# Patient Record
Sex: Female | Born: 1995 | Race: Black or African American | Hispanic: No | Marital: Single | Smoking: Never smoker
Health system: Southern US, Community
[De-identification: ages and names within clinical notes are randomized; demographics above are authoritative.]

---
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
## 2013-03-30 ENCOUNTER — Encounter (HOSPITAL_COMMUNITY): Payer: Self-pay | Admitting: Emergency Medicine

## 2013-03-30 ENCOUNTER — Other Ambulatory Visit (HOSPITAL_COMMUNITY)
Admission: RE | Admit: 2013-03-30 | Discharge: 2013-03-30 | Disposition: A | Discharge status: Home / Self Care | Payer: PRIVATE HEALTH INSURANCE | Source: Ambulatory Visit | Attending: Family Medicine | Admitting: Family Medicine

## 2013-03-30 ENCOUNTER — Emergency Department (INDEPENDENT_AMBULATORY_CARE_PROVIDER_SITE_OTHER)
Admission: EM | Admit: 2013-03-30 | Discharge: 2013-03-30 | Disposition: A | Discharge status: Home / Self Care | Payer: PRIVATE HEALTH INSURANCE | Source: Home / Self Care

## 2013-03-30 DIAGNOSIS — N760 Acute vaginitis: Secondary | ICD-10-CM | POA: Insufficient documentation

## 2013-03-30 DIAGNOSIS — Z113 Encounter for screening for infections with a predominantly sexual mode of transmission: Secondary | ICD-10-CM | POA: Insufficient documentation

## 2013-03-30 DIAGNOSIS — B373 Candidiasis of vulva and vagina: Secondary | ICD-10-CM

## 2013-03-30 LAB — POCT URINALYSIS DIP (DEVICE)
Bilirubin Urine: NEGATIVE
Glucose, UA: NEGATIVE mg/dL
Ketones, ur: NEGATIVE mg/dL
Nitrite: NEGATIVE
pH: 6 (ref 5.0–8.0)

## 2013-03-30 MED ORDER — FLUCONAZOLE 150 MG PO TABS: 150.0000 mg | ORAL_TABLET | Freq: Every day | ORAL | Status: DC

## 2013-03-30 NOTE — ED Notes (Signed)
C/o vaginal irritation since Friday.   No relief with otc yeast meds.  Denies pelvic/abdominal pain.

## 2013-03-30 NOTE — ED Provider Notes (Addendum)
Olivia Snow is a 17 y.o. female who presents to Urgent Care today for evaluation of vaginal itching.   Patient describes vaginal itching starting on Friday. Symptoms have been progressive. Associated with thick white vaginal discharge. No pelvic or abdominal pain. Just finished period recently LMP 03/24/13. Patient has tried over the counter yeast medications without relief. No other treatments. Feels similar to previous yeast infections.   ROS-Denies nausea/vomiting/fever/chills/fatigue/overall sick feelings. Denies abdominal pain, diarrhea. Denies dysuria or polyuria.   Past medical history-previously healthy with exception of 2 yeast infections in the past. Was treated for UTI in ED within last few months but no recent antibiotics (and urine culture was negative).  Social history-sexually active in last year with more than 1 partner. Last STD check 3-4 months ago.  Meds-no current meds Allergies-NKDA  History  Substance Use Topics  . Smoking status: Never Smoker   . Smokeless tobacco: Not on file  . Alcohol Use: No   ROS as above Medications reviewed. No current facility-administered medications for this encounter.   Current Outpatient Prescriptions  Medication Sig Dispense Refill  . norethindrone-ethinyl estradiol-iron (LOESTRIN FE 1.5/30) 1.5-30 MG-MCG tablet Take 1 tablet by mouth at bedtime.       Exam:  BP 117/60  Pulse 68  Temp(Src) 98.1 F (36.7 C) (Oral)  Resp 14  SpO2 100%  LMP 03/24/2013 Gen: Well NAD HEENT: Mucous membranes are moist. Lungs: Normal work of breathing. CTABL Heart: RRR no MRG Abd: NABS, Soft. NT, ND Pelvic: cervix normal in appearance, external genitalia normal, no adnexal masses or tenderness, no cervical motion tenderness and positive findings: vaginal discharge:  white, thick and curd-like Exts: no edema  Results for orders placed during the hospital encounter of 03/30/13 (from the past 24 hour(s))  POCT URINALYSIS DIP (DEVICE)      Status: Abnormal   Collection Time    03/30/13  7:35 PM      Result Value Range   Glucose, UA NEGATIVE  NEGATIVE mg/dL   Bilirubin Urine NEGATIVE  NEGATIVE   Ketones, ur NEGATIVE  NEGATIVE mg/dL   Specific Gravity, Urine 1.025  1.005 - 1.030   Hgb urine dipstick TRACE (*) NEGATIVE   pH 6.0  5.0 - 8.0   Protein, ur NEGATIVE  NEGATIVE mg/dL   Urobilinogen, UA 1.0  0.0 - 1.0 mg/dL   Nitrite NEGATIVE  NEGATIVE   Leukocytes, UA SMALL (*) NEGATIVE  POCT PREGNANCY, URINE     Status: None   Collection Time    03/30/13  7:38 PM      Result Value Range   Preg Test, Ur NEGATIVE  NEGATIVE   Assessment and Plan: 17 y.o. female with likely candidal yeast infection. Treated with diflucan 150mg  x1. Cervical sample sent for affirm testing (wet prep including trichomonas), gonorrhea, chlamydia. Will alert patient if other treatment required.   Trace blood noted on UA but period recently ended. Small leukocytes but patient asymptomatic. Will not treat for UTI.     Shelva Majestic, MD 03/30/13 2051   Addendum: wet prep positive for BV. Have sent in Rx and asked staff to communicate this with patient.   Shelva Majestic, MD 04/05/13 [phone]

## 2013-03-31 NOTE — ED Provider Notes (Signed)
Medical screening examination/treatment/procedure(s) were performed by resident physician or non-physician practitioner and as supervising physician I was immediately available for consultation/collaboration.   Zani Kyllonen DOUGLAS MD.   Avanthika Dehnert D Holley Kocurek, MD 03/31/13 1527 

## 2013-04-05 ENCOUNTER — Telehealth (HOSPITAL_COMMUNITY): Payer: Self-pay | Admitting: *Deleted

## 2013-04-05 MED ORDER — METRONIDAZOLE 500 MG PO TABS: 500.0000 mg | ORAL_TABLET | Freq: Two times a day (BID) | ORAL | Status: DC

## 2013-04-05 NOTE — ED Provider Notes (Signed)
Medical screening examination/treatment/procedure(s) were performed by resident physician or non-physician practitioner and as supervising physician I was immediately available for consultation/collaboration.   Lucresia Simic DOUGLAS MD.   Dub Maclellan D Tida Saner, MD 04/05/13 1826 

## 2013-04-05 NOTE — ED Notes (Addendum)
GC/Chlamydia neg., Affirm: Candida and Gardnerella pos. and Trich neg.  Pt. adequately treated with Diflucan for yeast. 12/18 Message sent to Dr. Artis Flock 12/19 Message sent to Dr. Durene Cal.  12/22 Dr. Durene Cal e-prescribed Flagyl to [facility] on [address]. I called pt. and left a message to call. Call 1. Olivia Snow 04/05/2013 Pt. called back.  Pt. verified x 2 and given results.  Pt. Told she was adequately treated with the Diflucan for yeast and needs Flagyl for bacterial vaginosis.  I explained what bacterial vaginosis was to her.   Pt. instructed to no alcohol while taking this medication. Pt. told where to pick up the Rx. Pt. voiced understanding.   04/05/2013

## 2013-05-29 ENCOUNTER — Other Ambulatory Visit (HOSPITAL_COMMUNITY)
Admission: RE | Admit: 2013-05-29 | Discharge: 2013-05-29 | Disposition: A | Discharge status: Home / Self Care | Payer: PRIVATE HEALTH INSURANCE | Source: Ambulatory Visit | Attending: Family Medicine | Admitting: Family Medicine

## 2013-05-29 ENCOUNTER — Emergency Department (INDEPENDENT_AMBULATORY_CARE_PROVIDER_SITE_OTHER)
Admission: EM | Admit: 2013-05-29 | Discharge: 2013-05-29 | Disposition: A | Discharge status: Home / Self Care | Payer: PRIVATE HEALTH INSURANCE | Source: Home / Self Care | Attending: Family Medicine | Admitting: Family Medicine

## 2013-05-29 ENCOUNTER — Encounter (HOSPITAL_COMMUNITY): Payer: Self-pay | Admitting: Emergency Medicine

## 2013-05-29 DIAGNOSIS — Z113 Encounter for screening for infections with a predominantly sexual mode of transmission: Secondary | ICD-10-CM | POA: Insufficient documentation

## 2013-05-29 DIAGNOSIS — N760 Acute vaginitis: Secondary | ICD-10-CM | POA: Insufficient documentation

## 2013-05-29 MED ORDER — FLUCONAZOLE 150 MG PO TABS: 150.0000 mg | ORAL_TABLET | Freq: Once | ORAL | Status: DC

## 2013-05-29 MED ORDER — METRONIDAZOLE 500 MG PO TABS: 500.0000 mg | ORAL_TABLET | Freq: Two times a day (BID) | ORAL | Status: DC

## 2013-05-29 MED ORDER — TERCONAZOLE 80 MG VA SUPP: 80.0000 mg | Freq: Every day | VAGINAL | Status: DC

## 2013-05-29 NOTE — ED Provider Notes (Signed)
CSN: [id number]     Arrival date & time 05/29/13  1853 History   First MD Initiated Contact with Patient 05/29/13 1858     Chief Complaint  Patient presents with  . Vaginal Itching     (Consider location/radiation/quality/duration/timing/severity/associated sxs/prior Treatment) Patient is a 18 y.o. female presenting with vaginal itching. The history is provided by the patient.  Vaginal Itching This is a new problem. The current episode started more than 2 days ago. The problem has been gradually worsening. Pertinent negatives include no abdominal pain.    History reviewed. No pertinent past medical history. History reviewed. No pertinent past surgical history. No family history on file. History  Substance Use Topics  . Smoking status: Never Smoker   . Smokeless tobacco: Not on file  . Alcohol Use: No   OB History   Grav Para Term Preterm Abortions TAB SAB Ect Mult Living                 Review of Systems  Constitutional: Negative.   Gastrointestinal: Negative for abdominal pain.  Genitourinary: Positive for vaginal discharge. Negative for vaginal bleeding, menstrual problem and pelvic pain.      Allergies  Review of patient's allergies indicates no known allergies.  Home Medications   Current Outpatient Rx  Name  Route  Sig  Dispense  Refill  . fluconazole (DIFLUCAN) 150 MG tablet   Oral   Take 1 tablet (150 mg total) by mouth daily.   1 tablet   0   . fluconazole (DIFLUCAN) 150 MG tablet   Oral   Take 1 tablet (150 mg total) by mouth once. Repeat in 1 week   1 tablet   1   . metroNIDAZOLE (FLAGYL) 500 MG tablet   Oral   Take 1 tablet (500 mg total) by mouth 2 (two) times daily. Do not use alcohol while taking this medication.   14 tablet   0   . metroNIDAZOLE (FLAGYL) 500 MG tablet   Oral   Take 1 tablet (500 mg total) by mouth 2 (two) times daily. Until finished   14 tablet   0   . nitrofurantoin, macrocrystal-monohydrate, (MACROBID) 100 MG  capsule   Oral   Take 1 capsule (100 mg total) by mouth 2 (two) times daily.   10 capsule   0   . norethindrone-ethinyl estradiol-iron (LOESTRIN FE 1.5/30) 1.5-30 MG-MCG tablet   Oral   Take 1 tablet by mouth at bedtime.         Marland Kitchen terconazole (TERAZOL 3) 80 MG vaginal suppository   Vaginal   Place 1 suppository (80 mg total) vaginally at bedtime.   3 suppository   0    BP 120/62  Pulse 74  Temp(Src) 99.1 F (37.3 C) (Oral)  Resp 18  SpO2 99% Physical Exam  Nursing note and vitals reviewed. Constitutional: She is oriented to person, place, and time. She appears well-developed and well-nourished.  Abdominal: Soft. Bowel sounds are normal.  Genitourinary: Uterus normal. Pelvic exam was performed with patient supine. There is no lesion on the right labia. There is no lesion on the left labia. Cervix exhibits no motion tenderness, no discharge and no friability. Right adnexum displays no tenderness. Left adnexum displays no tenderness. There is erythema around the vagina. No tenderness or bleeding around the vagina. No foreign body around the vagina. Vaginal discharge found.  Neurological: She is alert and oriented to person, place, and time.  Skin: Skin is warm and dry.  ED Course  Procedures (including critical care time) Labs Review Labs Reviewed - No data to display Imaging Review No results found.    MDM   Final diagnoses:  Vaginitis and vulvovaginitis        Linna HoffJames D Ryosuke Ericksen, MD 05/29/13 [phone]

## 2013-05-29 NOTE — ED Notes (Signed)
C/o onset yesterday of vaginal itching, so she started monistat cream and restarted Rx she didn't finish the last time she had a vaginal infection

## 2013-05-29 NOTE — Discharge Instructions (Signed)
Use medicine as prescribed, we will call if tests show a need for other treatment.  

## 2013-05-31 LAB — CERVICOVAGINAL ANCILLARY ONLY
Chlamydia: NEGATIVE
Neisseria Gonorrhea: NEGATIVE
WET PREP (BD AFFIRM): NEGATIVE
WET PREP (BD AFFIRM): NEGATIVE
Wet Prep (BD Affirm): NEGATIVE

## 2014-01-19 ENCOUNTER — Encounter (HOSPITAL_COMMUNITY): Payer: Self-pay | Admitting: Emergency Medicine

## 2014-01-19 ENCOUNTER — Other Ambulatory Visit (HOSPITAL_COMMUNITY)
Admission: RE | Admit: 2014-01-19 | Discharge: 2014-01-19 | Disposition: A | Discharge status: Home / Self Care | Payer: PRIVATE HEALTH INSURANCE | Source: Ambulatory Visit | Attending: Family Medicine | Admitting: Family Medicine

## 2014-01-19 ENCOUNTER — Emergency Department (INDEPENDENT_AMBULATORY_CARE_PROVIDER_SITE_OTHER)
Admission: EM | Admit: 2014-01-19 | Discharge: 2014-01-19 | Disposition: A | Discharge status: Home / Self Care | Payer: PRIVATE HEALTH INSURANCE | Source: Home / Self Care | Attending: Family Medicine | Admitting: Family Medicine

## 2014-01-19 DIAGNOSIS — N760 Acute vaginitis: Secondary | ICD-10-CM | POA: Insufficient documentation

## 2014-01-19 DIAGNOSIS — N898 Other specified noninflammatory disorders of vagina: Secondary | ICD-10-CM

## 2014-01-19 LAB — POCT URINALYSIS DIP (DEVICE)
BILIRUBIN URINE: NEGATIVE
GLUCOSE, UA: NEGATIVE mg/dL
KETONES UR: NEGATIVE mg/dL
Nitrite: NEGATIVE
PH: 6 (ref 5.0–8.0)
Protein, ur: 30 mg/dL — AB
Specific Gravity, Urine: 1.025 (ref 1.005–1.030)
Urobilinogen, UA: 0.2 mg/dL (ref 0.0–1.0)

## 2014-01-19 LAB — POCT PREGNANCY, URINE: PREG TEST UR: NEGATIVE

## 2014-01-19 MED ORDER — FLUCONAZOLE 150 MG PO TABS
150.0000 mg | ORAL_TABLET | ORAL | Status: DC
Start: 2014-01-19 — End: 2014-07-09

## 2014-01-19 MED ORDER — METRONIDAZOLE 500 MG PO TABS: 500.0000 mg | ORAL_TABLET | Freq: Two times a day (BID) | ORAL | Status: DC

## 2014-01-19 NOTE — ED Notes (Signed)
C/o vag irritation and clear d/c onset Monday Denies abd pain, urinary sx, f/vn/d Alert, no signs of acute distress.

## 2014-01-19 NOTE — ED Provider Notes (Signed)
Olivia Snow is a 18 y.o. female who presents to Urgent Care today for vaginal irritation. Symptoms starting 3 days ago. She's tried over-the-counter yeast medications which have not helped. No fevers or chills nausea vomiting or diarrhea. Patient denies any urinary symptoms.  History reviewed. No pertinent past medical history. History  Substance Use Topics  . Smoking status: Never Smoker   . Smokeless tobacco: Not on file  . Alcohol Use: No   ROS as above Medications: No current facility-administered medications for this encounter.   Current Outpatient Prescriptions  Medication Sig Dispense Refill  . fluconazole (DIFLUCAN) 150 MG tablet Take 1 tablet (150 mg total) by mouth once a week.  2 tablet  1  . metroNIDAZOLE (FLAGYL) 500 MG tablet Take 1 tablet (500 mg total) by mouth 2 (two) times daily.  14 tablet  1  . norethindrone-ethinyl estradiol-iron (LOESTRIN FE 1.5/30) 1.5-30 MG-MCG tablet Take 1 tablet by mouth at bedtime.        Exam:  BP 119/75  Pulse 65  Temp(Src) 98.1 F (36.7 C) (Oral)  Resp 18  SpO2 100%  LMP 01/12/2014 Gen: Well NAD HEENT: EOMI,  MMM Lungs: Normal work of breathing. CTABL Heart: RRR no MRG Abd: NABS, Soft. Nondistended, Nontender Exts: Brisk capillary refill, warm and well perfused.  External genitalia with mild discharge. No lesions present.  Results for orders placed during the hospital encounter of 01/19/14 (from the past 24 hour(s))  POCT URINALYSIS DIP (DEVICE)     Status: Abnormal   Collection Time    01/19/14  7:39 PM      Result Value Ref Range   Glucose, UA NEGATIVE  NEGATIVE mg/dL   Bilirubin Urine NEGATIVE  NEGATIVE   Ketones, ur NEGATIVE  NEGATIVE mg/dL   Specific Gravity, Urine 1.025  1.005 - 1.030   Hgb urine dipstick MODERATE (*) NEGATIVE   pH 6.0  5.0 - 8.0   Protein, ur 30 (*) NEGATIVE mg/dL   Urobilinogen, UA 0.2  0.0 - 1.0 mg/dL   Nitrite NEGATIVE  NEGATIVE   Leukocytes, UA LARGE (*) NEGATIVE  POCT PREGNANCY,  URINE     Status: None   Collection Time    01/19/14  7:42 PM      Result Value Ref Range   Preg Test, Ur NEGATIVE  NEGATIVE   No results found.  Assessment and Plan: 18 y.o. female with vaginitis. Patient did not want a speculum exam. We did a blind swab for yeast trichomonas and BV. Plan for empiric treatment with Flagyl and Diflucan. Urine culture pending.  Discussed warning signs or symptoms. Please see discharge instructions. Patient expresses understanding.     Rodolph BongEvan S Sandie Swayze, MD 01/19/14 2121

## 2014-01-19 NOTE — Discharge Instructions (Signed)
Thank you for coming in today. °If your belly pain worsens, or you have high fever, bad vomiting, blood in your stool or black tarry stool go to the Emergency Room.  ° °Vaginitis °Vaginitis is an inflammation of the vagina. It is most often caused by a change in the normal balance of the bacteria and yeast that live in the vagina. This change in balance causes an overgrowth of certain bacteria or yeast, which causes the inflammation. There are different types of vaginitis, but the most common types are: °· Bacterial vaginosis. °· Yeast infection (candidiasis). °· Trichomoniasis vaginitis. This is a sexually transmitted infection (STI). °· Viral vaginitis. °· Atropic vaginitis. °· Allergic vaginitis. °CAUSES  °The cause depends on the type of vaginitis. Vaginitis can be caused by: °· Bacteria (bacterial vaginosis). °· Yeast (yeast infection). °· A parasite (trichomoniasis vaginitis) °· A virus (viral vaginitis). °· Low hormone levels (atrophic vaginitis). Low hormone levels can occur during pregnancy, breastfeeding, or after menopause. °· Irritants, such as bubble baths, scented tampons, and feminine sprays (allergic vaginitis). °Other factors can change the normal balance of the yeast and bacteria that live in the vagina. These include: °· Antibiotic medicines. °· Poor hygiene. °· Diaphragms, vaginal sponges, spermicides, birth control pills, and intrauterine devices (IUD). °· Sexual intercourse. °· Infection. °· Uncontrolled diabetes. °· A weakened immune system. °SYMPTOMS  °Symptoms can vary depending on the cause of the vaginitis. Common symptoms include: °· Abnormal vaginal discharge. °¨ The discharge is white, gray, or yellow with bacterial vaginosis. °¨ The discharge is thick, white, and cheesy with a yeast infection. °¨ The discharge is frothy and yellow or greenish with trichomoniasis. °· A bad vaginal odor. °¨ The odor is fishy with bacterial vaginosis. °· Vaginal itching, pain, or swelling. °· Painful  intercourse. °· Pain or burning when urinating. °Sometimes, there are no symptoms. °TREATMENT  °Treatment will vary depending on the type of infection.  °· Bacterial vaginosis and trichomoniasis are often treated with antibiotic creams or pills. °· Yeast infections are often treated with antifungal medicines, such as vaginal creams or suppositories. °· Viral vaginitis has no cure, but symptoms can be treated with medicines that relieve discomfort. Your sexual partner should be treated as well. °· Atrophic vaginitis may be treated with an estrogen cream, pill, suppository, or vaginal ring. If vaginal dryness occurs, lubricants and moisturizing creams may help. You may be told to avoid scented soaps, sprays, or douches. °· Allergic vaginitis treatment involves quitting the use of the product that is causing the problem. Vaginal creams can be used to treat the symptoms. °HOME CARE INSTRUCTIONS  °· Take all medicines as directed by your caregiver. °· Keep your genital area clean and dry. Avoid soap and only rinse the area with water. °· Avoid douching. It can remove the healthy bacteria in the vagina. °· Do not use tampons or have sexual intercourse until your vaginitis has been treated. Use sanitary pads while you have vaginitis. °· Wipe from front to back. This avoids the spread of bacteria from the rectum to the vagina. °· Let air reach your genital area. °¨ Wear cotton underwear to decrease moisture buildup. °¨ Avoid wearing underwear while you sleep until your vaginitis is gone. °¨ Avoid tight pants and underwear or nylons without a cotton panel. °¨ Take off wet clothing (especially bathing suits) as soon as possible. °· Use mild, non-scented products. Avoid using irritants, such as: °¨ Scented feminine sprays. °¨ Fabric softeners. °¨ Scented detergents. °¨ Scented tampons. °¨ Scented   soaps or bubble baths. °· Practice safe sex and use condoms. Condoms may prevent the spread of trichomoniasis and viral  vaginitis. °SEEK MEDICAL CARE IF:  °· You have abdominal pain. °· You have a fever or persistent symptoms for more than 2-3 days. °· You have a fever and your symptoms suddenly get worse. °Document Released: 01/27/2007 Document Revised: 12/25/2011 Document Reviewed: 09/12/2011 °[employer]® Patient Information ©2015 [employer]. This information is not intended to replace advice given to you by your health care provider. Make sure you discuss any questions you have with your health care provider. ° °

## 2014-01-21 LAB — CERVICOVAGINAL ANCILLARY ONLY
WET PREP (BD AFFIRM): NEGATIVE
WET PREP (BD AFFIRM): NEGATIVE
WET PREP (BD AFFIRM): POSITIVE — AB

## 2014-01-21 LAB — URINE CULTURE
Colony Count: 70000
Special Requests: NORMAL

## 2014-01-22 ENCOUNTER — Telehealth: Payer: Self-pay | Admitting: Family Medicine

## 2014-01-22 MED ORDER — CEPHALEXIN 500 MG PO CAPS: 500.0000 mg | ORAL_CAPSULE | Freq: Two times a day (BID) | ORAL | Status: DC

## 2014-01-22 NOTE — ED Notes (Signed)
Pt called back.  Advised of results and Rx  Rodolph BongEvan S Savaya Hakes, MD 01/22/14 [phone]

## 2014-01-22 NOTE — ED Notes (Signed)
Culture positive for GBS.  Keflex called in.  Pt called and a voicemail was left.  Rodolph BongEvan S Soul Hackman, MD 01/22/14 [phone]

## 2014-01-24 NOTE — ED Notes (Addendum)
Affirm: Candida pos., Gardnerella and Trich neg. Pt. adequately treated with Diflucan. Vassie MoselleYork, Keane Martelli M 01/24/2014

## 2014-07-09 ENCOUNTER — Encounter (HOSPITAL_COMMUNITY): Payer: Self-pay

## 2014-07-09 ENCOUNTER — Emergency Department (HOSPITAL_COMMUNITY)
Admission: EM | Admit: 2014-07-09 | Discharge: 2014-07-09 | Disposition: A | Discharge status: Home / Self Care | Payer: PRIVATE HEALTH INSURANCE | Source: Home / Self Care | Attending: Emergency Medicine | Admitting: Emergency Medicine

## 2014-07-09 DIAGNOSIS — M79672 Pain in left foot: Secondary | ICD-10-CM | POA: Diagnosis not present

## 2014-07-09 DIAGNOSIS — M2142 Flat foot [pes planus] (acquired), left foot: Secondary | ICD-10-CM

## 2014-07-09 MED ORDER — IBUPROFEN 800 MG PO TABS: 800.0000 mg | ORAL_TABLET | Freq: Three times a day (TID) | ORAL | Status: DC | PRN

## 2014-07-09 NOTE — ED Provider Notes (Signed)
CSN: [id number]     Arrival date & time 07/09/14  1533 History   First MD Initiated Contact with Patient 07/09/14 1552     Chief Complaint  Patient presents with  . Foot Pain   (Consider location/radiation/quality/duration/timing/severity/associated sxs/prior Treatment) HPI  She is a 19 year old woman here for evaluation of left foot pain. She states this started about 2 weeks ago when she noticed lateral foot pain at the end of a shift. 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 She applied ice and took some ibuprofen which resolved the pain. She had another episode of pain last week that again resolved with ice and ibuprofen. Today, she had another recurrence of the pain. She has not taken anything yet today. She denies any injury or trauma. She states with the first episode she had some swelling, but has not noticed swelling today.  History reviewed. No pertinent past medical history. History reviewed. No pertinent past surgical history. History reviewed. No pertinent family history. History  Substance Use Topics  . Smoking status: Never Smoker   . Smokeless tobacco: Not on file  . Alcohol Use: No   OB History    No data available     Review of Systems As in history of present illness Allergies  Review of patient's allergies indicates no known allergies.  Home Medications   Prior to Admission medications   Medication Sig Start Date End Date Taking? Authorizing Provider  norethindrone-ethinyl estradiol-iron (LOESTRIN FE 1.5/30) 1.5-30 MG-MCG tablet Take 1 tablet by mouth at bedtime.   Yes Historical Provider, MD  ibuprofen (ADVIL,MOTRIN) 800 MG tablet Take 1 tablet (800 mg total) by mouth every 8 (eight) hours as needed for moderate pain. 07/09/14   Charm RingsErin J Kaytlen Lightsey, MD   BP 123/74 mmHg  Pulse 69  Temp(Src) 98.2 F (36.8 C) (Oral)  Resp 12  SpO2 100% Physical Exam  Constitutional: She is oriented to person, place, and time. She appears well-developed and well-nourished. No  distress.  Cardiovascular: Normal rate.   Pulmonary/Chest: Effort normal.  Musculoskeletal:  Left foot: No erythema or swelling. She does have some tenderness over the lateral dorsal foot. No ankle tenderness. She has full range of motion. Some mild pain with resisted dorsiflexion. 2+ DP pulse. When she stands she has moderate collapse of her arch.  Neurological: She is alert and oriented to person, place, and time.    ED Course  Procedures (including critical care time) Labs Review Labs Reviewed - No data to display  Imaging Review No results found.   MDM   1. Left foot pain   2. Pes planus of left foot    Treat symptomatically with ibuprofen 800 mg 3 times a day as needed. Recommended use of an arch support insole. If this worsens, she should follow-up with sports medicine.    Charm RingsErin J Onur Mori, MD 07/09/14 1620

## 2014-07-09 NOTE — ED Notes (Signed)
C/o pain left foot lateral aspect , onset 2 weeks ago. No known trauma. States pain has returned, stabbing type. Used ice and aleve when pain first sstarted

## 2014-07-09 NOTE — Discharge Instructions (Signed)
You have some strain in the tendons and ligaments of your foot. This is likely from some mild loss of your arch. Take ibuprofen 800 mg every 8 hours as needed. Please get an arch support insole to wearing in your shoes. Start with wearing the arch supports for 2 hours a day, and gradually increase until you are wearing them all day. If this is getting worse, please make an appointment to see sports medicine.

## 2014-10-03 ENCOUNTER — Ambulatory Visit (INDEPENDENT_AMBULATORY_CARE_PROVIDER_SITE_OTHER): Payer: PRIVATE HEALTH INSURANCE | Admitting: Internal Medicine

## 2014-10-03 VITALS — BP 110/70 | HR 111 | Temp 98.5°F | Resp 18 | Wt 144.8 lb

## 2014-10-03 DIAGNOSIS — R8281 Pyuria: Secondary | ICD-10-CM

## 2014-10-03 DIAGNOSIS — N390 Urinary tract infection, site not specified: Secondary | ICD-10-CM

## 2014-10-03 DIAGNOSIS — R1011 Right upper quadrant pain: Secondary | ICD-10-CM

## 2014-10-03 DIAGNOSIS — N898 Other specified noninflammatory disorders of vagina: Secondary | ICD-10-CM

## 2014-10-03 DIAGNOSIS — Z7251 High risk heterosexual behavior: Secondary | ICD-10-CM | POA: Diagnosis not present

## 2014-10-03 LAB — POCT URINALYSIS DIPSTICK
Glucose, UA: NEGATIVE
Ketones, UA: >=160
NITRITE UA: POSITIVE
PH UA: 6
PROTEIN UA: 100
RBC UA: NEGATIVE
SPEC GRAV UA: 1.02
UROBILINOGEN UA: 0.2

## 2014-10-03 LAB — POCT URINE PREGNANCY: Preg Test, Ur: NEGATIVE

## 2014-10-03 LAB — POCT UA - MICROSCOPIC ONLY
CASTS, UR, LPF, POC: NEGATIVE
Crystals, Ur, HPF, POC: NEGATIVE
Mucus, UA: NEGATIVE
Yeast, UA: NEGATIVE

## 2014-10-03 MED ORDER — CIPROFLOXACIN HCL 500 MG PO TABS: 500.0000 mg | ORAL_TABLET | Freq: Two times a day (BID) | ORAL | Status: AC

## 2014-10-03 NOTE — Progress Notes (Addendum)
Subjective:  This chart was scribed for Ellamae Sia MD, by Veverly Fells, at [facility].This patient was seen in room 10 and the patient's care was started at 2:28 PM.    Patient ID: Olivia Snow, female    DOB: [birth year], 19 y.o.   MRN: [id number] Chief Complaint  Patient presents with  . Abdominal Pain     9 days  . light headed    x friday  . Headache    x 1 day  . Urinary Frequency    x friday  . feels warm at times    HPI   HPI Comments: Olivia Snow is a 19 y.o. female who presents to the [facility] complaining of intermittent right sided radiating abdominal pain (to her back and across her abdomen) onset 8 days ago.  She has associated symptoms of a loss of appetite, frequency, and has been waking up with increased sweating/feels very hot. Her symptoms do not worsen after she eats.  She denies any constipation/diarrhea,cough, or palpitations.   Discharge: She had unusual discharge after urination - 1 month ago- went to her gynecologist and was diagnosed with bacterial vaginitis and given Flagyl . She was call 2 days later and told Chlamydia was positive and given 1 g of Zithromax. Treatment was advised for her partner and she says that he completed it.  She stopped using her birth control 1 month ago after the infection and plans on starting again after her next menstrual cycle (last 1 month ago).  Patient is sexually active with 1 partner for the last 5 months and has sex without condom use since discontinuing birth control pills.   Headache: She was at work yesterday [employer]) and felt very light-headed with a headache as well as numbness in her fingers.      There are no active problems to display for this patient.  History reviewed. No pertinent past medical history. History reviewed. No pertinent past surgical history. No Known Allergies Prior to Admission medications   Medication Sig Start Date End Date  Taking? Authorizing Provider  norethindrone-ethinyl estradiol-iron (LOESTRIN FE 1.5/30) 1.5-30 MG-MCG tablet Take 1 tablet by mouth at bedtime.   Yes Historical Provider, MD  ibuprofen (ADVIL,MOTRIN) 800 MG tablet Take 1 tablet (800 mg total) by mouth every 8 (eight) hours as needed for moderate pain. Patient not taking: Reported on 10/03/2014 07/09/14   Charm Rings, MD   History   Social History  . Marital Status: Single    Spouse Name: N/A  . Number of Children: N/A  . Years of Education: N/A   Occupational History  . Not on file.   Social History Main Topics  . Smoking status: Never Smoker   . Smokeless tobacco: Not on file  . Alcohol Use: 0.0 oz/week    0 Standard drinks or equivalent per week  . Drug Use: Yes  . Sexual Activity: Yes    Birth Control/ Protection: Pill   Other Topics Concern  . Not on file   Social History Narrative     Current Outpatient Prescriptions on File Prior to Visit  Medication Sig Dispense Refill  . norethindrone-ethinyl estradiol-iron (LOESTRIN FE 1.5/30) 1.5-30 MG-MCG tablet Take 1 tablet by mouth at bedtime.    Marland Kitchen ibuprofen (ADVIL,MOTRIN) 800 MG tablet Take 1 tablet (800 mg total) by mouth every 8 (eight) hours as needed for moderate pain. (Patient not taking: Reported on 10/03/2014) 30 tablet 0   No  current facility-administered medications on file prior to visit.    No Known Allergies    Review of Systems  Constitutional: Positive for appetite change.  HENT: Negative for postnasal drip, rhinorrhea and sinus pressure.   Eyes: Negative for pain, discharge and itching.  Respiratory: Negative for cough, choking and chest tightness.   Cardiovascular: Negative for chest pain and palpitations.  Gastrointestinal: Positive for abdominal pain. Negative for diarrhea and constipation.  Genitourinary: Positive for frequency and vaginal discharge. Negative for menstrual problem.  Neurological: Positive for light-headedness, numbness and headaches.        Objective:   Physical Exam  Constitutional: She is oriented to person, place, and time. She appears well-developed and well-nourished. No distress.  HENT:  Head: Normocephalic and atraumatic.  Mouth/Throat: Oropharynx is clear and moist.  Eyes: Conjunctivae and EOM are normal. Pupils are equal, round, and reactive to light.  Neck: Normal range of motion. Neck supple. No thyromegaly present.  Cardiovascular: Normal rate, regular rhythm and normal heart sounds.   No murmur heard. Pulmonary/Chest: Effort normal. No respiratory distress.  Abdominal:  Tender to palpation in the right upper quadrant right middle quadrant with tenderness to percussion right flank No rebound or guarding  Musculoskeletal: Normal range of motion.  Lymphadenopathy:    She has no cervical adenopathy.  Neurological: She is alert and oriented to person, place, and time. No cranial nerve deficit.  Skin: Skin is warm and dry.  Psychiatric: She has a normal mood and affect. Her behavior is normal.  Nursing note and vitals reviewed.   Filed Vitals:   10/03/14 1419  BP: 110/70  Pulse: 111  Temp: 98.5 F (36.9 C)  TempSrc: Oral  Resp: 18  Weight: 144 lb 12.8 oz (65.681 kg)  SpO2: 99%   Results for orders placed or performed in visit on 10/03/14  POCT urine pregnancy  Result Value Ref Range   Preg Test, Ur Negative Negative  POCT urinalysis dipstick  Result Value Ref Range   Color, UA light brown    Clarity, UA clear    Glucose, UA neg    Bilirubin, UA small    Ketones, UA >=160    Spec Grav, UA 1.020    Blood, UA neg    pH, UA 6.0    Protein, UA 100    Urobilinogen, UA 0.2    Nitrite, UA positive    Leukocytes, UA small (1+) (A) Negative  POCT UA - Microscopic Only  Result Value Ref Range   WBC, Ur, HPF, POC 10-15    RBC, urine, microscopic 1-3    Bacteria, U Microscopic 2+    Mucus, UA neg    Epithelial cells, urine per micros 5-10    Crystals, Ur, HPF, POC neg    Casts, Ur, LPF,  POC neg    Yeast, UA neg          Assessment & Plan:  I have completed the patient encounter in its entirety as documented by the scribe, with editing by me where necessary. Olivia Snow P. Merla Riches, M.D.  Abdominal pain, RUQ - Plan: POCT UA - Microscopic Only  Vaginal discharge - Plan: POCT urinalysis dipstick, POCT UA - Microscopic Only  Unprotected sexual intercourse - Plan: POCT urine pregnancy, POCT UA - Microscopic Only  Pyuria - Plan: Urine culture  This seems consistent with a urinary tract infection trying to advance to the right kidney Urine will be cultured Meds ordered this encounter  Medications  . ciprofloxacin (CIPRO) 500 MG tablet  Sig: Take 1 tablet (500 mg total) by mouth 2 (two) times daily.    Dispense:  14 tablet    Refill:  0   If culture negative we will proceed with chlamydia retesting

## 2014-10-04 ENCOUNTER — Telehealth: Payer: Self-pay

## 2014-10-04 ENCOUNTER — Ambulatory Visit (INDEPENDENT_AMBULATORY_CARE_PROVIDER_SITE_OTHER): Payer: PRIVATE HEALTH INSURANCE | Admitting: Emergency Medicine

## 2014-10-04 ENCOUNTER — Telehealth: Payer: Self-pay | Admitting: *Deleted

## 2014-10-04 ENCOUNTER — Telehealth: Payer: Self-pay | Admitting: Internal Medicine

## 2014-10-04 ENCOUNTER — Emergency Department (HOSPITAL_COMMUNITY)
Admission: EM | Admit: 2014-10-04 | Discharge: 2014-10-05 | Disposition: A | Discharge status: Home / Self Care | Payer: PRIVATE HEALTH INSURANCE | Attending: Emergency Medicine | Admitting: Emergency Medicine

## 2014-10-04 ENCOUNTER — Encounter (HOSPITAL_COMMUNITY): Payer: Self-pay | Admitting: Emergency Medicine

## 2014-10-04 ENCOUNTER — Ambulatory Visit
Admission: RE | Admit: 2014-10-04 | Discharge: 2014-10-04 | Disposition: A | Discharge status: Home / Self Care | Payer: PRIVATE HEALTH INSURANCE | Source: Ambulatory Visit | Attending: Emergency Medicine | Admitting: Emergency Medicine

## 2014-10-04 ENCOUNTER — Emergency Department (HOSPITAL_COMMUNITY): Payer: PRIVATE HEALTH INSURANCE

## 2014-10-04 VITALS — BP 90/60 | HR 127 | Temp 98.7°F | Resp 18 | Wt 143.0 lb

## 2014-10-04 DIAGNOSIS — R1011 Right upper quadrant pain: Secondary | ICD-10-CM | POA: Diagnosis present

## 2014-10-04 DIAGNOSIS — N10 Acute tubulo-interstitial nephritis: Secondary | ICD-10-CM

## 2014-10-04 DIAGNOSIS — Z792 Long term (current) use of antibiotics: Secondary | ICD-10-CM | POA: Diagnosis not present

## 2014-10-04 DIAGNOSIS — Z3202 Encounter for pregnancy test, result negative: Secondary | ICD-10-CM | POA: Insufficient documentation

## 2014-10-04 DIAGNOSIS — N898 Other specified noninflammatory disorders of vagina: Secondary | ICD-10-CM | POA: Diagnosis not present

## 2014-10-04 LAB — URINE MICROSCOPIC-ADD ON

## 2014-10-04 LAB — CBC WITH DIFFERENTIAL/PLATELET
BASOS ABS: 0 10*3/uL (ref 0.0–0.1)
Basophils Relative: 0 % (ref 0–1)
EOS ABS: 0 10*3/uL (ref 0.0–0.7)
Eosinophils Relative: 0 % (ref 0–5)
HEMATOCRIT: 38.4 % (ref 36.0–46.0)
Hemoglobin: 13.5 g/dL (ref 12.0–15.0)
LYMPHS ABS: 0.6 10*3/uL — AB (ref 0.7–4.0)
Lymphocytes Relative: 5 % — ABNORMAL LOW (ref 12–46)
MCH: 31 pg (ref 26.0–34.0)
MCHC: 35.2 g/dL (ref 30.0–36.0)
MCV: 88.3 fL (ref 78.0–100.0)
Monocytes Absolute: 0.8 10*3/uL (ref 0.1–1.0)
Monocytes Relative: 6 % (ref 3–12)
NEUTROS PCT: 89 % — AB (ref 43–77)
Neutro Abs: 11.7 10*3/uL — ABNORMAL HIGH (ref 1.7–7.7)
Platelets: 143 10*3/uL — ABNORMAL LOW (ref 150–400)
RBC: 4.35 MIL/uL (ref 3.87–5.11)
RDW: 12.9 % (ref 11.5–15.5)
WBC: 13.1 10*3/uL — AB (ref 4.0–10.5)

## 2014-10-04 LAB — POCT CBC
GRANULOCYTE PERCENT: 92.8 % — AB (ref 37–80)
HEMATOCRIT: 41.4 % (ref 37.7–47.9)
Hemoglobin: 13.6 g/dL (ref 12.2–16.2)
Lymph, poc: 0.7 (ref 0.6–3.4)
MCH: 29.9 pg (ref 27–31.2)
MCHC: 32.9 g/dL (ref 31.8–35.4)
MCV: 90.9 fL (ref 80–97)
MID (cbc): 0.2 (ref 0–0.9)
MPV: 8.2 fL (ref 0–99.8)
POC Granulocyte: 12.3 — AB (ref 2–6.9)
POC LYMPH %: 5.5 % — AB (ref 10–50)
POC MID %: 1.7 %M (ref 0–12)
Platelet Count, POC: 145 10*3/uL (ref 142–424)
RBC: 4.56 M/uL (ref 4.04–5.48)
RDW, POC: 12.9 %
WBC: 13.3 10*3/uL — AB (ref 4.6–10.2)

## 2014-10-04 LAB — LIPASE, BLOOD: LIPASE: 32 U/L (ref 22–51)

## 2014-10-04 LAB — AMYLASE: Amylase: 29 U/L (ref 0–105)

## 2014-10-04 LAB — COMPREHENSIVE METABOLIC PANEL
ALT: 14 U/L (ref 0–35)
ALT: 21 U/L (ref 14–54)
AST: 23 U/L (ref 0–37)
AST: 33 U/L (ref 15–41)
Albumin: 3.4 g/dL — ABNORMAL LOW (ref 3.5–5.2)
Albumin: 3.2 g/dL — ABNORMAL LOW (ref 3.5–5.0)
Alkaline Phosphatase: 65 U/L (ref 39–117)
Alkaline Phosphatase: 67 U/L (ref 38–126)
Anion gap: 10 (ref 5–15)
BILIRUBIN TOTAL: 0.6 mg/dL (ref 0.2–1.2)
BUN: 8 mg/dL (ref 6–23)
BUN: 6 mg/dL (ref 6–20)
CALCIUM: 8.9 mg/dL (ref 8.9–10.3)
CO2: 24 mEq/L (ref 19–32)
CO2: 20 mmol/L — ABNORMAL LOW (ref 22–32)
CREATININE: 0.86 mg/dL (ref 0.44–1.00)
Calcium: 9 mg/dL (ref 8.4–10.5)
Chloride: 104 mEq/L (ref 96–112)
Chloride: 104 mmol/L (ref 101–111)
Creat: 0.84 mg/dL (ref 0.50–1.10)
GFR calc non Af Amer: >60 mL/min (ref >60)
GLUCOSE: 115 mg/dL — AB (ref 65–99)
Glucose, Bld: 78 mg/dL (ref 70–99)
POTASSIUM: 4.2 meq/L (ref 3.5–5.3)
Potassium: 3.5 mmol/L (ref 3.5–5.1)
SODIUM: 140 meq/L (ref 135–145)
Sodium: 134 mmol/L — ABNORMAL LOW (ref 135–145)
Total Bilirubin: 0.7 mg/dL (ref 0.3–1.2)
Total Protein: 6.2 g/dL (ref 6.0–8.3)
Total Protein: 6.3 g/dL — ABNORMAL LOW (ref 6.5–8.1)

## 2014-10-04 LAB — URINALYSIS, ROUTINE W REFLEX MICROSCOPIC
Bilirubin Urine: NEGATIVE
Glucose, UA: NEGATIVE mg/dL
Ketones, ur: 40 mg/dL — AB
Nitrite: NEGATIVE
PH: 6 (ref 5.0–8.0)
Protein, ur: 100 mg/dL — AB
Specific Gravity, Urine: 1.021 (ref 1.005–1.030)
UROBILINOGEN UA: 1 mg/dL (ref 0.0–1.0)

## 2014-10-04 LAB — WET PREP, GENITAL
Trich, Wet Prep: NONE SEEN
Yeast Wet Prep HPF POC: NONE SEEN

## 2014-10-04 LAB — POC URINE PREG, ED: Preg Test, Ur: NEGATIVE

## 2014-10-04 LAB — LIPASE: Lipase: 21 U/L (ref 0–75)

## 2014-10-04 MED ORDER — HYDROCODONE-ACETAMINOPHEN 5-325 MG PO TABS: 1.0000 | ORAL_TABLET | Freq: Once | ORAL | Status: DC

## 2014-10-04 MED ORDER — IOHEXOL 300 MG/ML  SOLN
80.0000 mL | Freq: Once | INTRAMUSCULAR | Status: AC | PRN
  Administered 2014-10-04: 80 mL via INTRAVENOUS

## 2014-10-04 MED ORDER — MORPHINE SULFATE 4 MG/ML IJ SOLN
4.0000 mg | Freq: Once | INTRAMUSCULAR | Status: AC
Start: 2014-10-04 — End: 2014-10-04
  Administered 2014-10-04: 4 mg via INTRAVENOUS
  Filled 2014-10-04: qty 1

## 2014-10-04 MED ORDER — IOHEXOL 300 MG/ML  SOLN
25.0000 mL | Freq: Once | INTRAMUSCULAR | Status: AC | PRN
  Administered 2014-10-04: 25 mL via ORAL

## 2014-10-04 NOTE — ED Notes (Signed)
Pt. reports RUQ pain with nausea and headacheonset last Saturday , denies emesis or fever.

## 2014-10-04 NOTE — Telephone Encounter (Signed)
Spoke with pt's mom advised to RTC per Dr. Dareen Piano. Mom agreed.

## 2014-10-04 NOTE — Progress Notes (Addendum)
Subjective:  Patient ID: Olivia Snow, female    DOB: 03/13/1996  Age: 19 y.o. MRN: [id number]  CC: Follow-up   HPI Olivia Snow presents  right upper quadrant pain started about a week ago. She was seen in the office yesterday. There is no food provocation of pain. She has some diminished appetite denies any nausea vomiting or stool change. She was treated yesterday for a urinary tract infection. The concern that she has today is the antibiotic is "not working" because her pain is worse. She has no heartburn or indigestion no nausea or vomiting. No fever or chills. No specific food intolerance.  History Olivia Snow has no past medical history on file.   She has no past surgical history on file.   Her  family history includes Diabetes in her father.  She   reports that she has never smoked. She does not have any smokeless tobacco history on file. She reports that she drinks alcohol. She reports that she uses illicit drugs.  Outpatient Prescriptions Prior to Visit  Medication Sig Dispense Refill  . ciprofloxacin (CIPRO) 500 MG tablet Take 1 tablet (500 mg total) by mouth 2 (two) times daily. 14 tablet 0  . norethindrone-ethinyl estradiol-iron (LOESTRIN FE 1.5/30) 1.5-30 MG-MCG tablet Take 1 tablet by mouth at bedtime.    Marland Kitchen ibuprofen (ADVIL,MOTRIN) 800 MG tablet Take 1 tablet (800 mg total) by mouth every 8 (eight) hours as needed for moderate pain. (Patient not taking: Reported on 10/03/2014) 30 tablet 0   No facility-administered medications prior to visit.    History   Social History  . Marital Status: Single    Spouse Name: N/A  . Number of Children: N/A  . Years of Education: N/A   Social History Main Topics  . Smoking status: Never Smoker   . Smokeless tobacco: Not on file  . Alcohol Use: 0.0 oz/week    0 Standard drinks or equivalent per week  . Drug Use: Yes  . Sexual Activity: Yes    Birth Control/ Protection: Pill   Other Topics Concern  . None    Social History Narrative     Review of Systems  Constitutional: Negative for fever, chills and appetite change.  HENT: Negative for congestion, ear pain, postnasal drip, sinus pressure and sore throat.   Eyes: Negative for pain and redness.  Respiratory: Negative for cough, shortness of breath and wheezing.   Cardiovascular: Negative for leg swelling.  Gastrointestinal: Positive for abdominal pain. Negative for nausea, vomiting, diarrhea, constipation and blood in stool.  Endocrine: Negative for polyuria.  Genitourinary: Negative for dysuria, urgency, frequency and flank pain.  Musculoskeletal: Negative for gait problem.  Skin: Negative for rash.  Neurological: Negative for weakness and headaches.  Psychiatric/Behavioral: Negative for confusion and decreased concentration. The patient is not nervous/anxious.     Objective:  BP 90/60 mmHg  Pulse 127  Temp(Src) 98.7 F (37.1 C) (Oral)  Resp 18  Wt 143 lb (64.864 kg)  SpO2 99%  LMP 08/29/2014  Physical Exam  Constitutional: She is oriented to person, place, and time. She appears well-developed and well-nourished. No distress.  HENT:  Head: Normocephalic and atraumatic.  Right Ear: External ear normal.  Left Ear: External ear normal.  Nose: Nose normal.  Eyes: Conjunctivae and EOM are normal. Pupils are equal, round, and reactive to light. No scleral icterus.  Neck: Normal range of motion. Neck supple. No tracheal deviation present.  Cardiovascular: Normal rate, regular rhythm and normal heart sounds.   Pulmonary/Chest:  Effort normal. No respiratory distress. She has no wheezes. She has no rales.  Abdominal: She exhibits no mass. There is tenderness. There is guarding. There is no rebound.  Musculoskeletal: She exhibits no edema.  Lymphadenopathy:    She has no cervical adenopathy.  Neurological: She is alert and oriented to person, place, and time. Coordination normal.  Skin: Skin is warm and dry. No rash noted.   Psychiatric: She has a normal mood and affect. Her behavior is normal.      Assessment & Plan:   Olivia Snow was seen today for follow-up.  Diagnoses and all orders for this visit:  RUQ pain Orders: -     US Abdomen Limited RUQ; Future -     POCT CBC -     Comprehensive metabolic panel  I have discontinued Olivia Snow ibuprofen. I am also having her maintain her norethindrone-ethinyl estradiol-iron and ciprofloxacin.  No orders of the defined types were placed in this encounter.   She underwent ultrasound urgently which was negative I think the next step is dismal lab work and possibly a nuclear medicine study.  Appropriate red flag conditions were discussed with the patient as well as actions that should be taken.  Patient expressed his understanding.  Follow-up: Return if symptoms worsen or fail to improve.  Carmelina Dane, MD   Results for orders placed or performed in visit on 10/03/14  POCT urine pregnancy  Result Value Ref Range   Preg Test, Ur Negative Negative  POCT urinalysis dipstick  Result Value Ref Range   Color, UA light brown    Clarity, UA clear    Glucose, UA neg    Bilirubin, UA small    Ketones, UA >=160    Spec Grav, UA 1.020    Blood, UA neg    pH, UA 6.0    Protein, UA 100    Urobilinogen, UA 0.2    Nitrite, UA positive    Leukocytes, UA small (1+) (A) Negative  POCT UA - Microscopic Only  Result Value Ref Range   WBC, Ur, HPF, POC 10-15    RBC, urine, microscopic 1-3    Bacteria, U Microscopic 2+    Mucus, UA neg    Epithelial cells, urine per micros 5-10    Crystals, Ur, HPF, POC neg    Casts, Ur, LPF, POC neg    Yeast, UA neg

## 2014-10-04 NOTE — Telephone Encounter (Signed)
Spoke with pt, advised to RTC. She complained of upper rib pain and not getting better. Urine culture is still in process. Pt agreed to come in.

## 2014-10-04 NOTE — Telephone Encounter (Signed)
Pt states meds are not working,please advise     Best phone [phone]    [facility]

## 2014-10-04 NOTE — ED Provider Notes (Signed)
CSN: [id number]     Arrival date & time 10/04/14  2029 History   First MD Initiated Contact with Patient 10/04/14 2124     Chief Complaint  Patient presents with  . Abdominal Pain     (Consider location/radiation/quality/duration/timing/severity/associated sxs/prior Treatment) HPI Complains of abdominal pain right upper quadrant nonradiating onset 2 days ago. It is worse with changing positions. Nonradiating. No urinary symptoms. Last no more menstrual period May 2016. Associated symptoms include vaginal discharge. No anorexia. No fever. Patient was seen at [facility] urgent care yesterday and today treated with Cipro and ibuprofen. Ibuprofen temporarily alleviates pain. No other associated symptoms. Urinalysis obtained yesterday positive for leukocytes, nitrates, urine culture not done. Patient had right upper quadrant ultrasound performed today which was normal History reviewed. No pertinent past medical history. past medical history Chlamydia vaginitis History reviewed. No pertinent past surgical history. Family History  Problem Relation Age of Onset  . Diabetes Father    History  Substance Use Topics  . Smoking status: Never Smoker   . Smokeless tobacco: Not on file  . Alcohol Use: Yes   OB History    No data available     Review of Systems  Constitutional: Negative.   HENT: Negative.   Respiratory: Negative.   Cardiovascular: Negative.   Gastrointestinal: Positive for abdominal pain.  Genitourinary: Positive for vaginal discharge.  Musculoskeletal: Negative.   Skin: Negative.   Neurological: Negative.   Psychiatric/Behavioral: Negative.   All other systems reviewed and are negative.     Allergies  Review of patient's allergies indicates no known allergies.  Home Medications   Prior to Admission medications   Medication Sig Start Date End Date Taking? Authorizing Provider  ciprofloxacin (CIPRO) 500 MG tablet Take 1 tablet (500 mg total) by mouth 2 (two) times daily.  10/03/14   Tonye Pearson, MD  norethindrone-ethinyl estradiol-iron (LOESTRIN FE 1.5/30) 1.5-30 MG-MCG tablet Take 1 tablet by mouth at bedtime.    Historical Provider, MD   BP 134/84 mmHg  Pulse 109  Temp(Src) 100 F (37.8 C) (Oral)  Resp 16  Wt 145 lb 14.4 oz (66.18 kg)  SpO2 100%  LMP 09/08/2014 Physical Exam  Constitutional: She is oriented to person, place, and time. She appears well-developed and well-nourished. No distress.  HENT:  Head: Normocephalic and atraumatic.  Eyes: Conjunctivae are normal. Pupils are equal, round, and reactive to light.  Neck: Neck supple. No tracheal deviation present. No thyromegaly present.  Cardiovascular: Normal rate and regular rhythm.   No murmur heard. Pulmonary/Chest: Effort normal and breath sounds normal.  Abdominal: Soft. Bowel sounds are normal. She exhibits no distension and no mass. There is tenderness. There is no rebound and no guarding.  Tender at right upper quadrant  Genitourinary:  No external lesion positive whitish discharge. Cervical os closed no cervical motion tenderness no adnexal masses or tenderness  Musculoskeletal: Normal range of motion. She exhibits no edema or tenderness.  Neurological: She is alert and oriented to person, place, and time. Coordination normal.  Skin: Skin is warm and dry. No rash noted.  Psychiatric: She has a normal mood and affect.  Nursing note and vitals reviewed.   ED Course  Procedures (including critical care time) Labs Review Labs Reviewed  CBC WITH DIFFERENTIAL/PLATELET - Abnormal; Notable for the following:    WBC 13.1 (*)    Platelets 143 (*)    Neutrophils Relative % 89 (*)    Neutro Abs 11.7 (*)    Lymphocytes Relative 5 (*)  Lymphs Abs 0.6 (*)    All other components within normal limits  COMPREHENSIVE METABOLIC PANEL - Abnormal; Notable for the following:    Sodium 134 (*)    CO2 20 (*)    Glucose, Bld 115 (*)    Total Protein 6.3 (*)    Albumin 3.2 (*)    All other  components within normal limits  LIPASE, BLOOD  URINALYSIS, ROUTINE W REFLEX MICROSCOPIC (NOT AT [facility])  POC URINE PREG, ED    Imaging Review US Abdomen Limited Ruq  10/04/2014   CLINICAL DATA:  Right upper quadrant pain and nausea for 1 week  EXAM: US ABDOMEN LIMITED - RIGHT UPPER QUADRANT  COMPARISON:  None.  FINDINGS: Gallbladder:  No gallstones or wall thickening visualized. There is no pericholecystic fluid. No sonographic Murphy sign noted.  Common bile duct:  Diameter: 3 mm. There is no intrahepatic or extrahepatic biliary duct dilatation.  Liver:  No focal lesion identified. Within normal limits in parenchymal echogenicity.  IMPRESSION: Study within normal limits.   Electronically Signed   By: Bretta Bang III M.D.   On: 10/04/2014 11:43     EKG Interpretation None     1:05 AM pain under control and patient feels ready to go home after treatment with intravenous opiate. Results for orders placed or performed during the hospital encounter of 10/04/14  Wet prep, genital  Result Value Ref Range   Yeast Wet Prep HPF POC NONE SEEN NONE SEEN   Trich, Wet Prep NONE SEEN NONE SEEN   Clue Cells Wet Prep HPF POC MODERATE (A) NONE SEEN   WBC, Wet Prep HPF POC MANY (A) NONE SEEN  CBC with Differential  Result Value Ref Range   WBC 13.1 (H) 4.0 - 10.5 K/uL   RBC 4.35 3.87 - 5.11 MIL/uL   Hemoglobin 13.5 12.0 - 15.0 g/dL   HCT 64.6 80.3 - 21.2 %   MCV 88.3 78.0 - 100.0 fL   MCH 31.0 26.0 - 34.0 pg   MCHC 35.2 30.0 - 36.0 g/dL   RDW 24.8 25.0 - 03.7 %   Platelets 143 (L) 150 - 400 K/uL   Neutrophils Relative % 89 (H) 43 - 77 %   Neutro Abs 11.7 (H) 1.7 - 7.7 K/uL   Lymphocytes Relative 5 (L) 12 - 46 %   Lymphs Abs 0.6 (L) 0.7 - 4.0 K/uL   Monocytes Relative 6 3 - 12 %   Monocytes Absolute 0.8 0.1 - 1.0 K/uL   Eosinophils Relative 0 0 - 5 %   Eosinophils Absolute 0.0 0.0 - 0.7 K/uL   Basophils Relative 0 0 - 1 %   Basophils Absolute 0.0 0.0 - 0.1 K/uL  Comprehensive metabolic  panel  Result Value Ref Range   Sodium 134 (L) 135 - 145 mmol/L   Potassium 3.5 3.5 - 5.1 mmol/L   Chloride 104 101 - 111 mmol/L   CO2 20 (L) 22 - 32 mmol/L   Glucose, Bld 115 (H) 65 - 99 mg/dL   BUN 6 6 - 20 mg/dL   Creatinine, Ser 0.48 0.44 - 1.00 mg/dL   Calcium 8.9 8.9 - 88.9 mg/dL   Total Protein 6.3 (L) 6.5 - 8.1 g/dL   Albumin 3.2 (L) 3.5 - 5.0 g/dL   AST 33 15 - 41 U/L   ALT 21 14 - 54 U/L   Alkaline Phosphatase 67 38 - 126 U/L   Total Bilirubin 0.7 0.3 - 1.2 mg/dL   GFR calc non  Af Amer >60 >60 mL/min   GFR calc Af Amer >60 >60 mL/min   Anion gap 10 5 - 15  Lipase, blood  Result Value Ref Range   Lipase 32 22 - 51 U/L  Urinalysis, Routine w reflex microscopic (not at [facility])  Result Value Ref Range   Color, Urine AMBER (A) YELLOW   APPearance CLOUDY (A) CLEAR   Specific Gravity, Urine 1.021 1.005 - 1.030   pH 6.0 5.0 - 8.0   Glucose, UA NEGATIVE NEGATIVE mg/dL   Hgb urine dipstick SMALL (A) NEGATIVE   Bilirubin Urine NEGATIVE NEGATIVE   Ketones, ur 40 (A) NEGATIVE mg/dL   Protein, ur 102 (A) NEGATIVE mg/dL   Urobilinogen, UA 1.0 0.0 - 1.0 mg/dL   Nitrite NEGATIVE NEGATIVE   Leukocytes, UA MODERATE (A) NEGATIVE  Urine microscopic-add on  Result Value Ref Range   Squamous Epithelial / LPF FEW (A) RARE   WBC, UA 11-20 <3 WBC/hpf   RBC / HPF 3-6 <3 RBC/hpf   Bacteria, UA MANY (A) RARE  POC Urine Pregnancy, ED  (If Pre-menopausal female)  not at [facility]  Result Value Ref Range   Preg Test, Ur NEGATIVE NEGATIVE   Ct Abdomen Pelvis W Contrast  10/05/2014   CLINICAL DATA:  Patient with right upper quadrant pain. Nausea and headache.  EXAM: CT ABDOMEN AND PELVIS WITH CONTRAST  TECHNIQUE: Multidetector CT imaging of the abdomen and pelvis was performed using the standard protocol following bolus administration of intravenous contrast.  CONTRAST:  80mL OMNIPAQUE IOHEXOL 300 MG/ML  SOLN  COMPARISON:  Ultrasound earlier same date.  FINDINGS: Lower chest: No consolidative or  nodular pulmonary opacities. Normal heart size.  Hepatobiliary: Fatty deposition adjacent to the falciform ligament. Gallbladder is unremarkable. No intrahepatic or extrahepatic biliary ductal dilatation.  Pancreas: Unremarkable  Spleen: Unremarkable.  Adrenals/Urinary Tract: Normal adrenal glands. Left kidney is unremarkable. Within the superior pole of the right kidney there is a 3.4 x 3.5 cm multiseptated cystic mass with an internal density of 51 Hounsfield units. Small amount of fluid adjacent to the superior pole of the right kidney.  Stomach/Bowel: The bowel is normal and coursing caliber without evidence for obstruction. The appendix is normal. No free intraperitoneal air.  Vascular/Lymphatic: Normal caliber abdominal aorta. No retroperitoneal lymphadenopathy.  Other: Small amount of free fluid in the pelvis. Uterus is unremarkable.  Musculoskeletal: No aggressive or acute appearing osseous lesions.  IMPRESSION: Cystic mass within the superior pole of right kidney likely represents an area of focal pyelonephritis and possibly early abscess formation. Recommend short-term follow-up with either ultrasound or CT and clinical followup until resolution to exclude the possibility of underlying mass.   Electronically Signed   By: Annia Belt M.D.   On: 10/05/2014 00:44   US Abdomen Limited Ruq  10/04/2014   CLINICAL DATA:  Right upper quadrant pain and nausea for 1 week  EXAM: US ABDOMEN LIMITED - RIGHT UPPER QUADRANT  COMPARISON:  None.  FINDINGS: Gallbladder:  No gallstones or wall thickening visualized. There is no pericholecystic fluid. No sonographic Murphy sign noted.  Common bile duct:  Diameter: 3 mm. There is no intrahepatic or extrahepatic biliary duct dilatation.  Liver:  No focal lesion identified. Within normal limits in parenchymal echogenicity.  IMPRESSION: Study within normal limits.   Electronically Signed   By: Bretta Bang III M.D.   On: 10/04/2014 11:43    MDM  Chronically patient  with vaginitis. CT scan is consistent with pyelonephritis and possible abscess.  Patient does not require inpatient stay if she is presently feeling well, afebrile, nontoxic-appearing I discussed case with urologist Dr.Herrick. Plan discontinue Cipro urine sent for culture.  RPR, HIV, GC and CHL tests pending. Prescriptions Flagyl, Bactrim DS., norco, reglan Dr. Marlou Porch to see patient in office 10/07/2014 or 10/10/2014 Diagnosis#1 acute pyelonephritis #2vaginitis Final diagnoses:  None        Doug Sou, MD 10/05/14 1610

## 2014-10-04 NOTE — Addendum Note (Signed)
Addended by: Carmelina Dane on: 10/04/2014 03:54 PM   Modules accepted: Orders

## 2014-10-04 NOTE — Telephone Encounter (Signed)
Pt called back wanting to know her [place] results.  Her results were given; however, she wants to know what can she take for pain.  I told her Dr. Dareen Piano would review her results and we will be in contact with her soon.

## 2014-10-04 NOTE — Telephone Encounter (Signed)
Called mobile number listed on patient's chart. It was actually her mother's mobile number. I told her mother to have her call us back for her [place] results.

## 2014-10-04 NOTE — Patient Instructions (Signed)

## 2014-10-04 NOTE — Telephone Encounter (Signed)
Patient called back wanting to know if her results have been reviewed. She states that she is in pain. Please advise.

## 2014-10-05 LAB — H. PYLORI BREATH TEST: H. pylori Breath Test: NOT DETECTED

## 2014-10-05 LAB — GC/CHLAMYDIA PROBE AMP (~~LOC~~) NOT AT ARMC
Chlamydia: NEGATIVE
Neisseria Gonorrhea: NEGATIVE

## 2014-10-05 LAB — HIV ANTIBODY (ROUTINE TESTING W REFLEX): HIV Screen 4th Generation wRfx: NONREACTIVE

## 2014-10-05 LAB — RPR: RPR Ser Ql: NONREACTIVE

## 2014-10-05 MED ORDER — HYDROCODONE-ACETAMINOPHEN 5-325 MG PO TABS: 1.0000 | ORAL_TABLET | Freq: Four times a day (QID) | ORAL | Status: AC | PRN

## 2014-10-05 MED ORDER — METRONIDAZOLE 500 MG PO TABS: 500.0000 mg | ORAL_TABLET | Freq: Two times a day (BID) | ORAL | Status: AC

## 2014-10-05 MED ORDER — METRONIDAZOLE 500 MG PO TABS
500.0000 mg | ORAL_TABLET | Freq: Once | ORAL | Status: AC
  Administered 2014-10-05: 500 mg via ORAL
  Filled 2014-10-05: qty 1

## 2014-10-05 MED ORDER — SULFAMETHOXAZOLE-TRIMETHOPRIM 800-160 MG PO TABS: 1.0000 | ORAL_TABLET | Freq: Two times a day (BID) | ORAL | Status: AC

## 2014-10-05 MED ORDER — METOCLOPRAMIDE HCL 10 MG PO TABS: 10.0000 mg | ORAL_TABLET | Freq: Four times a day (QID) | ORAL | Status: AC | PRN

## 2014-10-05 MED ORDER — SULFAMETHOXAZOLE-TRIMETHOPRIM 800-160 MG PO TABS
1.0000 | ORAL_TABLET | Freq: Two times a day (BID) | ORAL | Status: DC
  Administered 2014-10-05: 1 via ORAL
  Filled 2014-10-05: qty 1

## 2014-10-05 NOTE — Discharge Instructions (Signed)
Take Tylenol for mild pain or the pain medicine prescribed  (Hydrocodone-apap)for bad pain. You've also been prescribed medicine for nausea (reglan). Make sure that you finish the 2 antibiotics prescribed. Avoid alcohol while taking the medications prescribed. Call Olivia Snow's office tomorrow to arrange to be seen Friday, 10/07/2014 or Monday, 10/10/2014. Return if you feel worse for any reason or unable to hold down the medications without vomiting.

## 2014-10-06 LAB — URINE CULTURE: CULTURE: NO GROWTH

## 2017-01-04 IMAGING — CT CT ABD-PELV W/ CM
2 of 4 series · 16 of 46 positions shown, 18 images · IV contrast (APPLIED)
Comparison: Ultrasound earlier same date.

CLINICAL DATA: Patient with right upper quadrant pain. Nausea and
headache.

EXAM:
CT ABDOMEN AND PELVIS WITH CONTRAST
TECHNIQUE: Multidetector CT imaging of the abdomen and pelvis was performed
using the standard protocol following bolus administration of
intravenous contrast.
CONTRAST:  80mL OMNIPAQUE IOHEXOL 300 MG/ML  SOLN

[Series 2: abd/ pelvis 5.0 i30f 1 · axial · 0.68mm/px · z∈[-442,-7]mm · 13 of 95 slices shown, 15 images]
[im 4/95  soft-tissue]
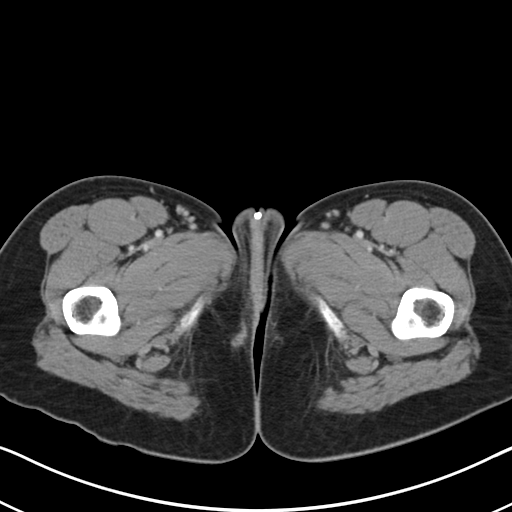
[im 4/95  bone]
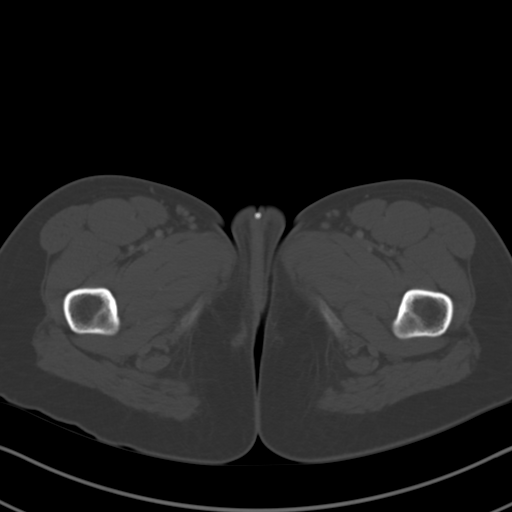
[im 12/95  soft-tissue]
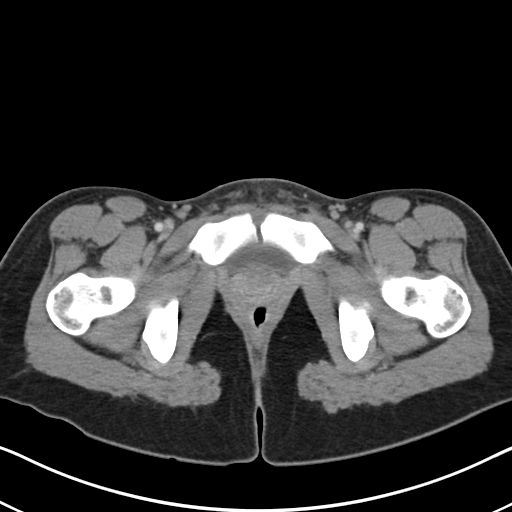
[im 19/95  soft-tissue]
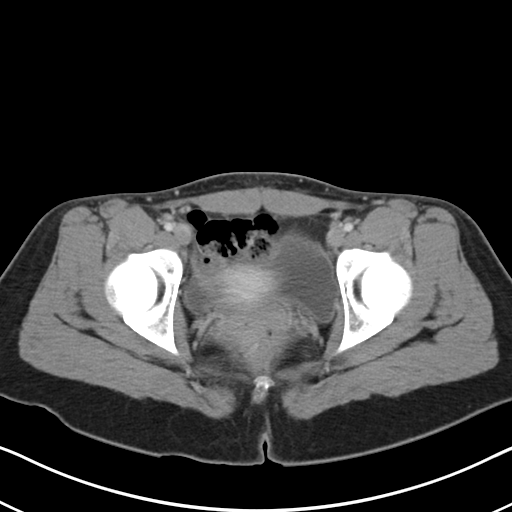
[im 27/95  soft-tissue]
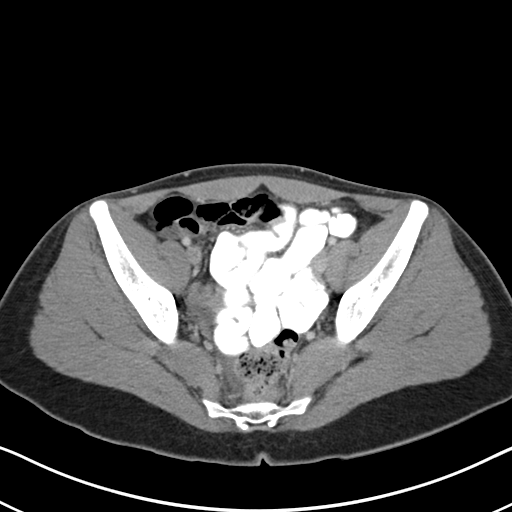
[im 34/95  soft-tissue]
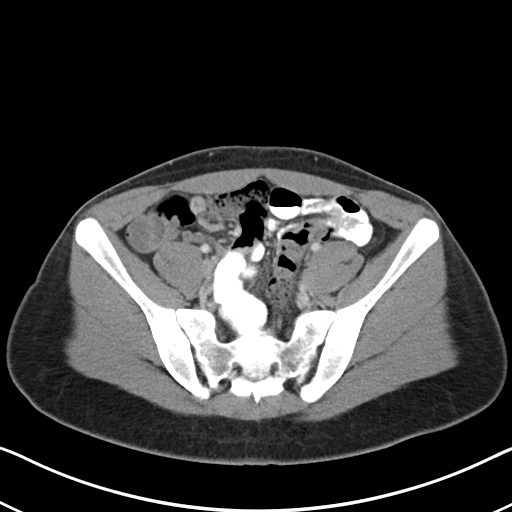
[im 42/95  soft-tissue]
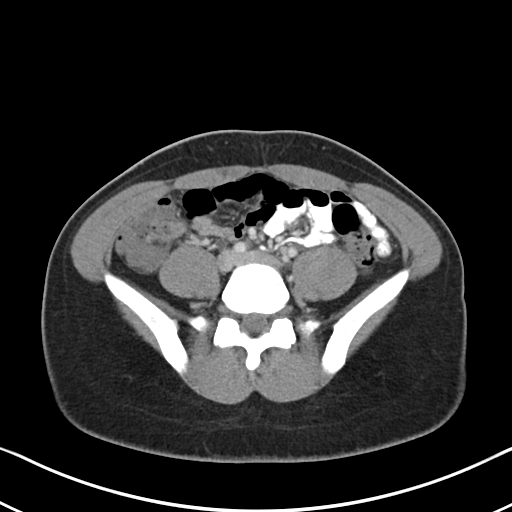
[im 49/95  soft-tissue]
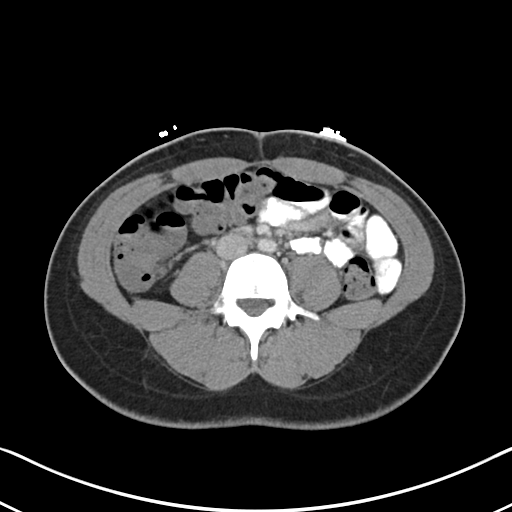
[im 53/95  soft-tissue]
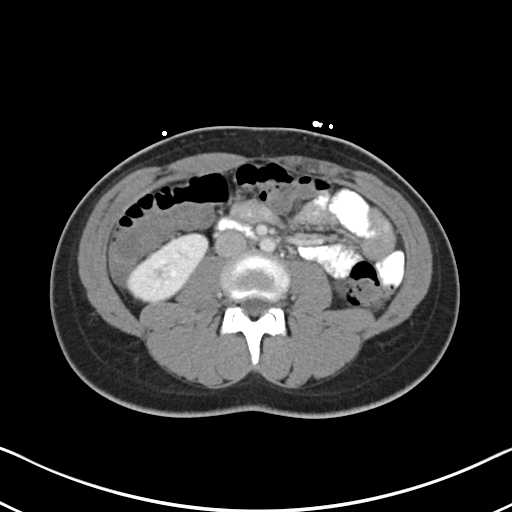
[im 61/95  soft-tissue]
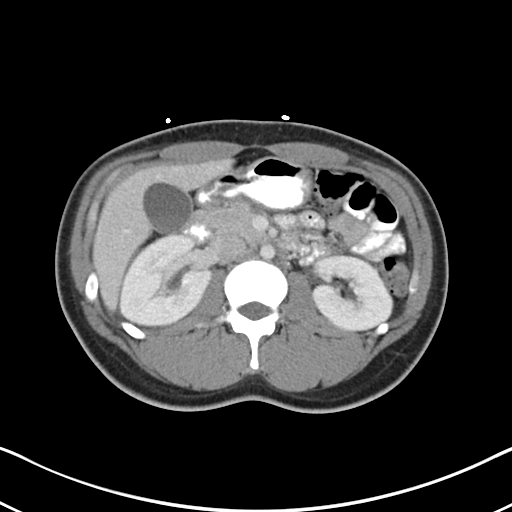
[im 61/95  bone]
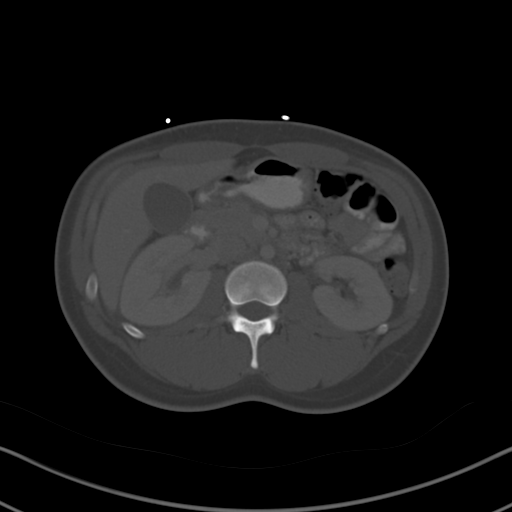
[im 68/95  soft-tissue]
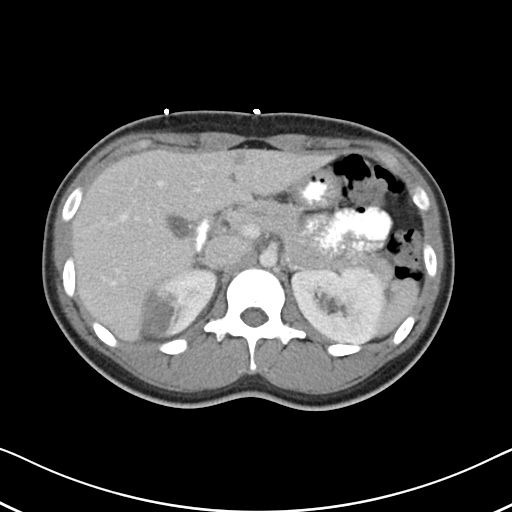
[im 76/95  soft-tissue]
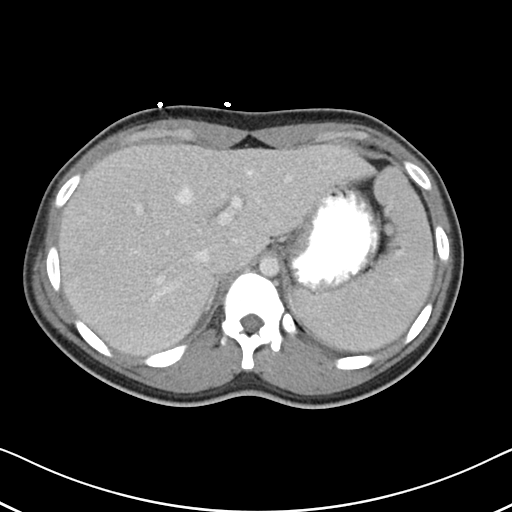
[im 83/95  soft-tissue]
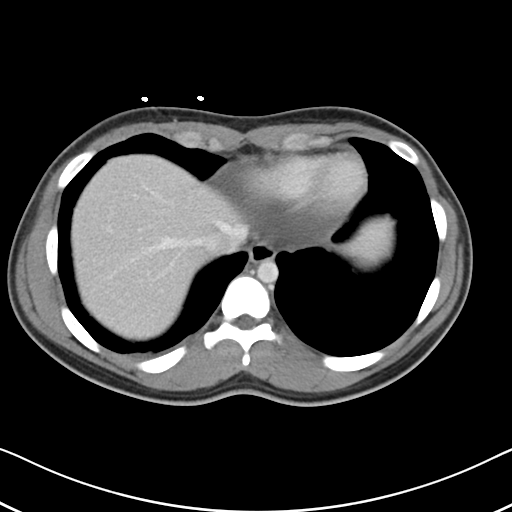
[im 91/95  soft-tissue]
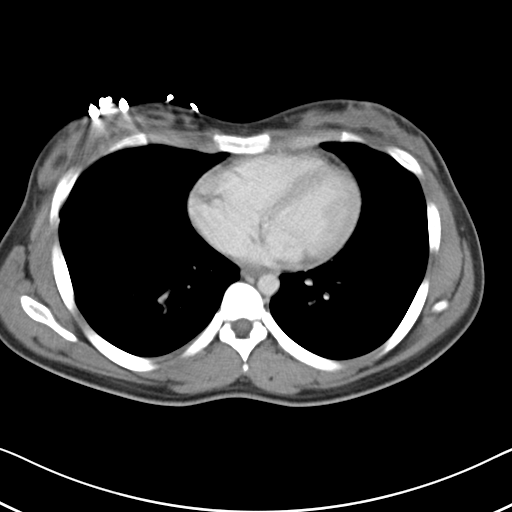

[Series 5: coronal soft tissue · coronal · 0.68mm/px · 3 of 74 slices shown]
[im 25/74  soft-tissue]
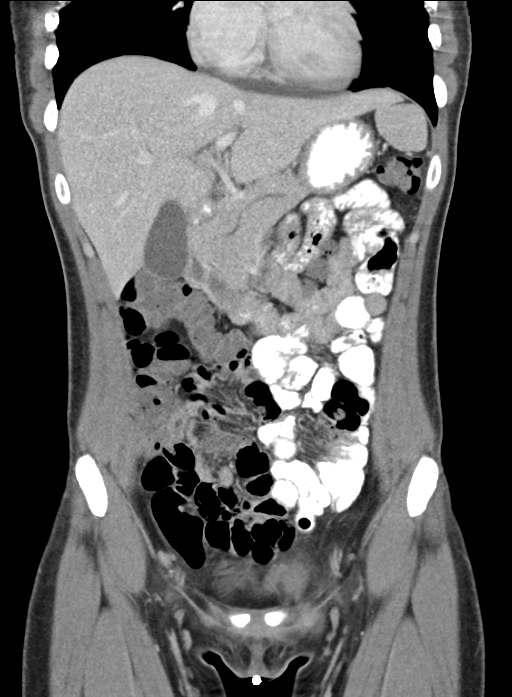
[im 33/74  soft-tissue]
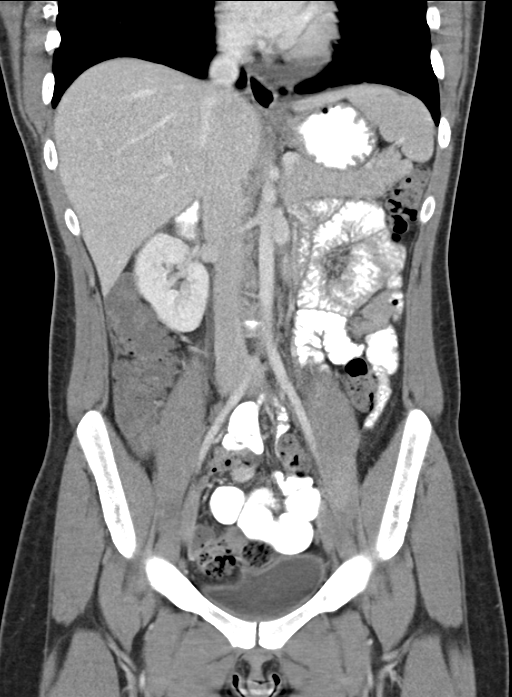
[im 41/74  soft-tissue]
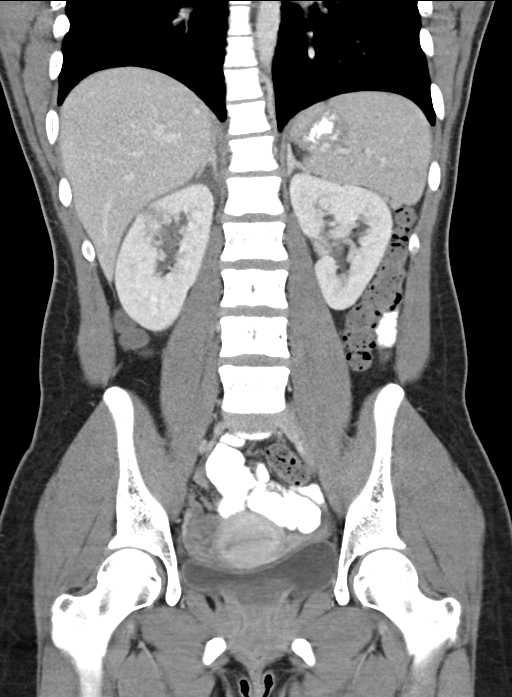

[16 of 46 positions shown; findings below may reference images not displayed]

FINDINGS: Lower chest: No consolidative or nodular pulmonary opacities. Normal
heart size.

Hepatobiliary: Fatty deposition adjacent to the falciform ligament.
Gallbladder is unremarkable. No intrahepatic or extrahepatic biliary
ductal dilatation.

Pancreas: Unremarkable

Spleen: Unremarkable.

Adrenals/Urinary Tract: Normal adrenal glands. Left kidney is
unremarkable. Within the superior pole of the right kidney there is
a 3.4 x 3.5 cm multiseptated cystic mass with an internal density of
51 Hounsfield units. Small amount of fluid adjacent to the superior
pole of the right kidney.

Stomach/Bowel: The bowel is normal and coursing caliber without
evidence for obstruction. The appendix is normal. No free
intraperitoneal air.

Vascular/Lymphatic: Normal caliber abdominal aorta. No
retroperitoneal lymphadenopathy.

Other: Small amount of free fluid in the pelvis. Uterus is
unremarkable.

Musculoskeletal: No aggressive or acute appearing osseous lesions.
IMPRESSION: Cystic mass within the superior pole of right kidney likely
represents an area of focal pyelonephritis and possibly early
abscess formation. Recommend short-term follow-up with either
ultrasound or CT and clinical followup until resolution to exclude
the possibility of underlying mass.
